# Patient Record
Sex: Female | Born: 1980 | Race: Black or African American | Hispanic: No | Marital: Married | Smoking: Current some day smoker
Health system: Southern US, Community
[De-identification: ages and names within clinical notes are randomized; demographics above are authoritative.]

## PROBLEM LIST (undated history)

## (undated) HISTORY — PX: CHOLECYSTECTOMY: SHX55

## (undated) HISTORY — PX: OTHER SURGICAL HISTORY: SHX169

---
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
## 2001-02-10 ENCOUNTER — Inpatient Hospital Stay (HOSPITAL_COMMUNITY): Admission: AD | Admit: 2001-02-10 | Discharge: 2001-02-13 | Payer: Self-pay | Admitting: Obstetrics & Gynecology

## 2001-02-12 ENCOUNTER — Encounter: Payer: Self-pay | Admitting: Obstetrics & Gynecology

## 2001-03-01 ENCOUNTER — Inpatient Hospital Stay (HOSPITAL_COMMUNITY): Admission: AD | Admit: 2001-03-01 | Discharge: 2001-03-01 | Payer: Self-pay | Admitting: Obstetrics and Gynecology

## 2001-04-30 ENCOUNTER — Other Ambulatory Visit: Admission: RE | Admit: 2001-04-30 | Discharge: 2001-04-30 | Payer: Self-pay | Admitting: Obstetrics and Gynecology

## 2001-05-30 ENCOUNTER — Ambulatory Visit (HOSPITAL_COMMUNITY): Admission: RE | Admit: 2001-05-30 | Discharge: 2001-05-30 | Payer: Self-pay | Admitting: Obstetrics and Gynecology

## 2001-05-30 ENCOUNTER — Encounter: Payer: Self-pay | Admitting: Obstetrics and Gynecology

## 2001-07-21 ENCOUNTER — Inpatient Hospital Stay (HOSPITAL_COMMUNITY): Admission: AD | Admit: 2001-07-21 | Discharge: 2001-07-23 | Payer: Self-pay | Admitting: Obstetrics and Gynecology

## 2003-07-09 ENCOUNTER — Other Ambulatory Visit: Admission: RE | Admit: 2003-07-09 | Discharge: 2003-07-09 | Payer: Self-pay | Admitting: Obstetrics and Gynecology

## 2003-09-08 ENCOUNTER — Encounter: Admission: RE | Admit: 2003-09-08 | Discharge: 2003-09-08 | Payer: Self-pay | Admitting: Gastroenterology

## 2004-02-17 ENCOUNTER — Other Ambulatory Visit: Admission: RE | Admit: 2004-02-17 | Discharge: 2004-02-17 | Payer: Self-pay | Admitting: Obstetrics and Gynecology

## 2004-08-09 ENCOUNTER — Observation Stay (HOSPITAL_COMMUNITY): Admission: RE | Admit: 2004-08-09 | Discharge: 2004-08-10 | Payer: Self-pay | Admitting: General Surgery

## 2005-04-19 ENCOUNTER — Other Ambulatory Visit: Admission: RE | Admit: 2005-04-19 | Discharge: 2005-04-19 | Payer: Self-pay | Admitting: Obstetrics and Gynecology

## 2005-09-27 ENCOUNTER — Ambulatory Visit (HOSPITAL_COMMUNITY): Admission: RE | Admit: 2005-09-27 | Discharge: 2005-09-27 | Payer: Self-pay | Admitting: Obstetrics and Gynecology

## 2005-12-10 ENCOUNTER — Inpatient Hospital Stay (HOSPITAL_COMMUNITY): Admission: AD | Admit: 2005-12-10 | Discharge: 2005-12-12 | Payer: Self-pay | Admitting: Obstetrics and Gynecology

## 2010-03-20 ENCOUNTER — Encounter: Payer: Self-pay | Admitting: Gastroenterology

## 2010-04-26 ENCOUNTER — Inpatient Hospital Stay (INDEPENDENT_AMBULATORY_CARE_PROVIDER_SITE_OTHER)
Admission: RE | Admit: 2010-04-26 | Discharge: 2010-04-26 | Disposition: A | Discharge status: Home / Self Care | Payer: 59 | Source: Ambulatory Visit | Attending: Family Medicine | Admitting: Family Medicine

## 2010-04-26 DIAGNOSIS — J40 Bronchitis, not specified as acute or chronic: Secondary | ICD-10-CM

## 2010-04-26 DIAGNOSIS — M549 Dorsalgia, unspecified: Secondary | ICD-10-CM

## 2010-04-26 DIAGNOSIS — R109 Unspecified abdominal pain: Secondary | ICD-10-CM

## 2010-04-26 LAB — POCT URINALYSIS DIPSTICK
Bilirubin Urine: NEGATIVE
Hgb urine dipstick: NEGATIVE
Ketones, ur: NEGATIVE mg/dL
Nitrite: NEGATIVE
Protein, ur: 30 mg/dL — AB
Specific Gravity, Urine: 1.025 (ref 1.005–1.030)
Urine Glucose, Fasting: NEGATIVE mg/dL
Urobilinogen, UA: 1 mg/dL (ref 0.0–1.0)
pH: 6.5 (ref 5.0–8.0)

## 2010-04-26 LAB — POCT PREGNANCY, URINE: Preg Test, Ur: NEGATIVE

## 2010-07-27 ENCOUNTER — Other Ambulatory Visit: Payer: Self-pay | Admitting: Obstetrics and Gynecology

## 2011-01-27 ENCOUNTER — Emergency Department (HOSPITAL_COMMUNITY)
Admission: EM | Admit: 2011-01-27 | Discharge: 2011-01-27 | Disposition: A | Discharge status: Home / Self Care | Payer: 59 | Source: Home / Self Care | Attending: Family Medicine | Admitting: Family Medicine

## 2011-01-27 DIAGNOSIS — J069 Acute upper respiratory infection, unspecified: Secondary | ICD-10-CM

## 2011-01-27 MED ORDER — AZITHROMYCIN 250 MG PO TABS: 250.0000 mg | ORAL_TABLET | Freq: Every day | ORAL | Status: AC

## 2011-01-27 MED ORDER — GUAIFENESIN-CODEINE 100-10 MG/5ML PO SYRP: 5.0000 mL | ORAL_SOLUTION | Freq: Four times a day (QID) | ORAL | Status: AC | PRN

## 2011-01-27 NOTE — ED Provider Notes (Signed)
History     CSN: [id number] Arrival date & time: 01/27/2011  9:45 AM   First MD Initiated Contact with Patient 01/27/11 1008      Chief Complaint  Patient presents with  . Cough    (Consider location/radiation/quality/duration/timing/severity/associated sxs/prior treatment) Patient is a 30 y.o. female presenting with cough. The history is provided by the patient.  Cough This is a new problem. The current episode started more than 2 days ago. The problem occurs constantly. The cough is non-productive. There has been no fever. Associated symptoms comments: NO RUNNY NOSE, NO SORE THROAT. HAS NOTED SOME SINUS CONGESTION AND POST NASAL DRIP. CHEST HURTS FROM COUGHING.HAS TAKEN OTC MEDS WITH MINIMAL RELIEF.    History reviewed. No pertinent past medical history.  Past Surgical History  Procedure Date  . Cholecystectomy     History reviewed. No pertinent family history.  History  Substance Use Topics  . Smoking status: Current Some Day Smoker  . Smokeless tobacco: Not on file  . Alcohol Use: No    OB History    Grav Para Term Preterm Abortions TAB SAB Ect Mult Living                  Review of Systems  Constitutional: Negative.   HENT: Positive for congestion.   Respiratory: Positive for cough.   Gastrointestinal: Negative.   Genitourinary: Negative.   Musculoskeletal: Positive for back pain.  Skin: Negative.     Allergies  Review of patient's allergies indicates no known allergies.  Home Medications   Current Outpatient Rx  Name Route Sig Dispense Refill  . AZITHROMYCIN 250 MG PO TABS Oral Take 1 tablet (250 mg total) by mouth daily. Take first 2 tablets together, then 1 every day until finished. 6 tablet 0  . GUAIFENESIN-CODEINE 100-10 MG/5ML PO SYRP Oral Take 5 mLs by mouth every 6 (six) hours as needed for cough. 120 mL 0    BP 117/77  Pulse 81  Temp(Src) 98.3 F (36.8 C) (Oral)  Resp 16  SpO2 100%  LMP 01/23/2011  Physical Exam  Nursing note and  vitals reviewed. Constitutional: She appears well-developed and well-nourished. No distress.  HENT:  Head: Normocephalic.  Right Ear: External ear normal.  Left Ear: External ear normal.  Nose: Nose normal.  Mouth/Throat: Oropharynx is clear and moist.  Neck: Normal range of motion. Neck supple.  Cardiovascular: Normal rate and regular rhythm.   Pulmonary/Chest: Effort normal and breath sounds normal. No respiratory distress.       CONGESTED COUGH  Lymphadenopathy:    She has no cervical adenopathy.  Skin: Skin is warm and dry.    ED Course  Procedures (including critical care time)  Labs Reviewed - No data to display No results found.   1. URI (upper respiratory infection)       MDM          Randa Spike, MD 01/27/11 [phone]

## 2011-01-27 NOTE — ED Notes (Signed)
3rd day duration of cough; c/o chest and neck discomfort , w cough until gags; green secretions since today; NAD

## 2011-04-19 ENCOUNTER — Emergency Department (HOSPITAL_COMMUNITY)
Admission: EM | Admit: 2011-04-19 | Discharge: 2011-04-19 | Disposition: A | Discharge status: Home / Self Care | Payer: 59 | Attending: Emergency Medicine | Admitting: Emergency Medicine

## 2011-04-19 ENCOUNTER — Encounter (HOSPITAL_COMMUNITY): Payer: Self-pay | Admitting: *Deleted

## 2011-04-19 DIAGNOSIS — M542 Cervicalgia: Secondary | ICD-10-CM | POA: Insufficient documentation

## 2011-04-19 DIAGNOSIS — M549 Dorsalgia, unspecified: Secondary | ICD-10-CM | POA: Insufficient documentation

## 2011-04-19 DIAGNOSIS — M7918 Myalgia, other site: Secondary | ICD-10-CM

## 2011-04-19 DIAGNOSIS — IMO0001 Reserved for inherently not codable concepts without codable children: Secondary | ICD-10-CM | POA: Insufficient documentation

## 2011-04-19 DIAGNOSIS — Y92410 Unspecified street and highway as the place of occurrence of the external cause: Secondary | ICD-10-CM | POA: Insufficient documentation

## 2011-04-19 MED ORDER — IBUPROFEN 600 MG PO TABS: 600.0000 mg | ORAL_TABLET | Freq: Four times a day (QID) | ORAL | Status: AC | PRN

## 2011-04-19 MED ORDER — TRAMADOL-ACETAMINOPHEN 37.5-325 MG PO TABS: ORAL_TABLET | ORAL | Status: AC

## 2011-04-19 MED ORDER — METHOCARBAMOL 500 MG PO TABS: 1000.0000 mg | ORAL_TABLET | Freq: Four times a day (QID) | ORAL | Status: AC

## 2011-04-19 MED ORDER — IBUPROFEN 800 MG PO TABS
800.0000 mg | ORAL_TABLET | Freq: Once | ORAL | Status: AC
  Administered 2011-04-19: 800 mg via ORAL
  Filled 2011-04-19: qty 1

## 2011-04-19 MED ORDER — CYCLOBENZAPRINE HCL 10 MG PO TABS
10.0000 mg | ORAL_TABLET | Freq: Once | ORAL | Status: AC
  Administered 2011-04-19: 10 mg via ORAL
  Filled 2011-04-19: qty 1

## 2011-04-19 NOTE — Discharge Instructions (Signed)
Ice packs to the injured or sore muscles for the next several days then start using heat. Take the medications for pain and muscle spasms.  Recheck if you aren't improving in the next week. You can call the office of Dr Thomasena Edis, the Orthopedist on call, to be rechecked if still painful next week.
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
## 2011-12-22 ENCOUNTER — Emergency Department (INDEPENDENT_AMBULATORY_CARE_PROVIDER_SITE_OTHER): Payer: 59

## 2011-12-22 ENCOUNTER — Encounter (HOSPITAL_COMMUNITY): Payer: Self-pay | Admitting: *Deleted

## 2011-12-22 ENCOUNTER — Emergency Department (INDEPENDENT_AMBULATORY_CARE_PROVIDER_SITE_OTHER)
Admission: EM | Admit: 2011-12-22 | Discharge: 2011-12-22 | Disposition: A | Discharge status: Home / Self Care | Payer: 59 | Source: Home / Self Care

## 2011-12-22 DIAGNOSIS — M542 Cervicalgia: Secondary | ICD-10-CM

## 2011-12-22 MED ORDER — NAPROXEN 375 MG PO TABS: 375.0000 mg | ORAL_TABLET | Freq: Two times a day (BID) | ORAL | Status: AC

## 2011-12-22 NOTE — ED Provider Notes (Signed)
Medical screening examination/treatment/procedure(s) were performed by non-physician practitioner and as supervising physician I was immediately available for consultation/collaboration.  Leslee Home, M.D.   Reuben Likes, MD 12/22/11 [phone]

## 2011-12-22 NOTE — ED Notes (Signed)
Pt  Reports  Neck pain   For  About  5  Months     denys  Any  specefic  Injury     She  Reports  The  Pain in  Her  Neck  Is  Worse  When  She  Swallows  -  She  Also  Reports  Some  Stiffness   r  Hand           denys  Any  Urinary  Symptoms    She  Is  Sitting  Upright on  Exam table  Speaking in  Complete  sentances
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
## 2011-12-22 NOTE — ED Notes (Signed)
Med    Cervical     Pains

## 2012-06-25 ENCOUNTER — Emergency Department (HOSPITAL_COMMUNITY)
Admission: EM | Admit: 2012-06-25 | Discharge: 2012-06-25 | Disposition: A | Discharge status: Home / Self Care | Payer: 59 | Source: Home / Self Care

## 2012-06-25 ENCOUNTER — Encounter (HOSPITAL_COMMUNITY): Payer: Self-pay | Admitting: *Deleted

## 2012-06-25 DIAGNOSIS — S2341XA Sprain of ribs, initial encounter: Secondary | ICD-10-CM

## 2012-06-25 MED ORDER — DICLOFENAC POTASSIUM 50 MG PO TABS: 50.0000 mg | ORAL_TABLET | Freq: Three times a day (TID) | ORAL | Status: DC

## 2012-06-25 NOTE — ED Notes (Signed)
Pt  States  l  Sided  Chest  Pain  Worse  When  She  Takes   A  Deep  Breath   She  Is  Sitting  Upright on  The  Exam table  Speaking in  Complete  sentances  And  Is  In no  Acute  Distress     Skin   Is  Warm   And  Dry   Pt is  Awake  And  Alert and  Oriented

## 2012-06-25 NOTE — ED Provider Notes (Signed)
History     CSN: [id number]  Arrival date & time 06/25/12  1701   None     Chief Complaint  Patient presents with  . Chest Pain    (Consider location/radiation/quality/duration/timing/severity/associated sxs/prior treatment) Patient is a 32 y.o. female presenting with chest pain. The history is provided by the patient.  Chest Pain Pain location:  L chest and L lateral chest Pain quality: sharp and stabbing   Pain radiates to:  Mid back Pain radiates to the back: yes   Pain severity:  Mild Duration:  6 hours Timing:  Constant Progression:  Unchanged Chronicity:  New Context: movement and raising an arm   Associated symptoms: no cough and no shortness of breath     History reviewed. No pertinent past medical history.  Past Surgical History  Procedure Laterality Date  . Cholecystectomy    . Btl      No family history on file.  History  Substance Use Topics  . Smoking status: Current Some Day Smoker    Types: Cigars  . Smokeless tobacco: Not on file  . Alcohol Use: No    OB History   Grav Para Term Preterm Abortions TAB SAB Ect Mult Living                  Review of Systems  Constitutional: Negative.   Respiratory: Negative for cough, chest tightness, shortness of breath and wheezing.   Cardiovascular: Positive for chest pain.  Gastrointestinal: Negative.     Allergies  Review of patient's allergies indicates no known allergies.  Home Medications   Current Outpatient Rx  Name  Route  Sig  Dispense  Refill  . diclofenac (CATAFLAM) 50 MG tablet   Oral   Take 1 tablet (50 mg total) by mouth 3 (three) times daily.   30 tablet   0   . ibuprofen (ADVIL,MOTRIN) 200 MG tablet   Oral   Take 400 mg by mouth every 6 (six) hours as needed. FOR PAIN         . naproxen (NAPROSYN) 375 MG tablet   Oral   Take 1 tablet (375 mg total) by mouth 2 (two) times daily.   20 tablet   0     BP 125/87  Pulse 78  Temp(Src) 98.7 F (37.1 C) (Oral)  Resp 16   SpO2 100%  LMP 06/07/2012  Physical Exam  Nursing note and vitals reviewed. Constitutional: She appears well-developed and well-nourished. She appears distressed.  Neck: Normal range of motion. Neck supple.  Cardiovascular: Normal rate, regular rhythm, normal heart sounds and intact distal pulses.   Pulmonary/Chest: Effort normal and breath sounds normal. She exhibits tenderness.  Abdominal: Soft. Bowel sounds are normal.  Skin: Skin is warm and dry.    ED Course  Procedures (including critical care time)  Labs Reviewed - No data to display No results found.   1. Costochondral joint sprain, initial encounter       MDM          Linna Hoff, MD 06/25/12 1747

## 2013-01-17 ENCOUNTER — Other Ambulatory Visit: Payer: Self-pay | Admitting: Obstetrics and Gynecology

## 2013-06-03 ENCOUNTER — Encounter (HOSPITAL_COMMUNITY): Payer: Self-pay | Admitting: Emergency Medicine

## 2013-06-03 ENCOUNTER — Emergency Department (HOSPITAL_COMMUNITY)
Admission: EM | Admit: 2013-06-03 | Discharge: 2013-06-03 | Disposition: A | Discharge status: Home / Self Care | Payer: 59 | Source: Home / Self Care

## 2013-06-03 DIAGNOSIS — J45909 Unspecified asthma, uncomplicated: Secondary | ICD-10-CM

## 2013-06-03 DIAGNOSIS — J309 Allergic rhinitis, unspecified: Secondary | ICD-10-CM

## 2013-06-03 DIAGNOSIS — H9209 Otalgia, unspecified ear: Secondary | ICD-10-CM

## 2013-06-03 DIAGNOSIS — Z720 Tobacco use: Secondary | ICD-10-CM

## 2013-06-03 DIAGNOSIS — H9202 Otalgia, left ear: Secondary | ICD-10-CM

## 2013-06-03 DIAGNOSIS — F17200 Nicotine dependence, unspecified, uncomplicated: Secondary | ICD-10-CM

## 2013-06-03 MED ORDER — ALBUTEROL SULFATE HFA 108 (90 BASE) MCG/ACT IN AERS: 2.0000 | INHALATION_SPRAY | RESPIRATORY_TRACT | Status: DC | PRN

## 2013-06-03 NOTE — ED Notes (Signed)
Pt denies fever,  N/v/d.  Mw,cma

## 2013-06-03 NOTE — ED Provider Notes (Signed)
CSN: [id number]     Arrival date & time 06/03/13  [id number] History   First MD Initiated Contact with Patient 06/03/13 [phone]     Chief Complaint  Patient presents with  . URI   (Consider location/radiation/quality/duration/timing/severity/associated sxs/prior Treatment) HPI Comments: 33 year old female presents with cough, any nose, watery itchy asHEENT: Supple, full range of motion without tenderness. A heavy feeling in the chest associated with PND and coughing scant amounts of yellow sputum She also has an area of pain in the left postauricular area. Denies pain in the ear. Continues to smoke. Has not taking any medications for her symptoms.   History reviewed. No pertinent past medical history. Past Surgical History  Procedure Laterality Date  . Cholecystectomy    . Btl     History reviewed. No pertinent family history. History  Substance Use Topics  . Smoking status: Current Some Day Smoker    Types: Cigars  . Smokeless tobacco: Not on file  . Alcohol Use: No   OB History   Grav Para Term Preterm Abortions TAB SAB Ect Mult Living                 Review of Systems  Constitutional: Negative for fever, chills, activity change, appetite change and fatigue.  HENT: Positive for congestion, postnasal drip and rhinorrhea. Negative for ear discharge, ear pain and facial swelling.   Eyes: Negative.   Respiratory: Positive for cough and chest tightness. Negative for shortness of breath and wheezing.   Cardiovascular: Negative.   Gastrointestinal: Negative.   Musculoskeletal: Negative for neck pain and neck stiffness.  Skin: Negative for pallor and rash.  Neurological: Negative.     Allergies  Review of patient's allergies indicates no known allergies.  Home Medications   Current Outpatient Rx  Name  Route  Sig  Dispense  Refill  . albuterol (PROVENTIL HFA;VENTOLIN HFA) 108 (90 BASE) MCG/ACT inhaler   Inhalation   Inhale 2 puffs into the lungs every 4 (four) hours as needed for  wheezing or shortness of breath.   1 Inhaler   0   . diclofenac (CATAFLAM) 50 MG tablet   Oral   Take 1 tablet (50 mg total) by mouth 3 (three) times daily.   30 tablet   0   . ibuprofen (ADVIL,MOTRIN) 200 MG tablet   Oral   Take 400 mg by mouth every 6 (six) hours as needed. FOR PAIN         . naproxen (NAPROSYN) 375 MG tablet   Oral   Take 1 tablet (375 mg total) by mouth 2 (two) times daily.   20 tablet   0    BP 108/71  Pulse 77  Temp(Src) 99 F (37.2 C) (Oral)  Resp 16  SpO2 100%  LMP 05/04/2013 Physical Exam  Nursing note and vitals reviewed. Constitutional: She is oriented to person, place, and time. She appears well-developed and well-nourished. No distress.  HENT:  Mouth/Throat: No oropharyngeal exudate.  Bilateral TMs are normal Oropharynx with no erythema, cobblestoning and clear PND Left postauricular exam reveals no skin discoloration, swelling or lesions. No tenderness to the left external ear. No regional lymphadenopathy. No tenderness directly over the mastoid.  Eyes: Conjunctivae and EOM are normal.  Neck: Normal range of motion. Neck supple.  Cardiovascular: Normal rate and regular rhythm.   Pulmonary/Chest: Effort normal and breath sounds normal. No respiratory distress. She has no rales.  Prolonged expiratory phase but no gross wheezing.  Abdominal: Soft. There is no tenderness.  Musculoskeletal: Normal range of motion. She exhibits no edema.  Lymphadenopathy:    She has no cervical adenopathy.  Neurological: She is alert and oriented to person, place, and time.  Skin: Skin is warm and dry. No rash noted.  Psychiatric: She has a normal mood and affect.    ED Course  Procedures (including critical care time) Labs Review Labs Reviewed - No data to display Imaging Review No results found.   MDM   1. Allergic rhinitis   2. RAD (reactive airway disease) with wheezing   3. Tobacco abuse disorder   4. Posterior auricular pain of left ear      No objective findings of etio for L post auricular pain and tenderness. IF worse will return or see PCP Flonase and saline NS Allegra 180 mg a day Albuterol for bronchospasm. Suspect this is treating to her cough as well as the PND. She does have the risk of bronchospasm due to smoking as well as RAD. Lots fo fluids Try to obtain a PCP ASAP Stop smoking.     Hayden Rasmussen, NP 06/03/13 1020

## 2013-06-03 NOTE — ED Notes (Signed)
C/o   Chest congestion.  Heaviness in chest.  Nonproductive cough.  Runny nose.   And pain behind left ear.  On set 4/3.   No otc meds taken for symptoms.

## 2013-06-03 NOTE — Discharge Instructions (Signed)
Allergic Rhinitis Allegra 180 mg a day Nasal saline spray, lots of it Flonase nasal spray as directed Allergic rhinitis is when the mucous membranes in the nose respond to allergens. Allergens are particles in the air that cause your body to have an allergic reaction. This causes you to release allergic antibodies. Through a chain of events, these eventually cause you to release histamine into the blood stream. Although meant to protect the body, it is this release of histamine that causes your discomfort, such as frequent sneezing, congestion, and an itchy, runny nose.  CAUSES  Seasonal allergic rhinitis (hay fever) is caused by pollen allergens that may come from grasses, trees, and weeds. Year-round allergic rhinitis (perennial allergic rhinitis) is caused by allergens such as house dust mites, pet dander, and mold spores.  SYMPTOMS   Nasal stuffiness (congestion).  Itchy, runny nose with sneezing and tearing of the eyes. DIAGNOSIS  Your health care provider can help you determine the allergen or allergens that trigger your symptoms. If you and your health care provider are unable to determine the allergen, skin or blood testing may be used. TREATMENT  Allergic Rhinitis does not have a cure, but it can be controlled by:  Medicines and allergy shots (immunotherapy).  Avoiding the allergen. Hay fever may often be treated with antihistamines in pill or nasal spray forms. Antihistamines block the effects of histamine. There are over-the-counter medicines that may help with nasal congestion and swelling around the eyes. Check with your health care provider before taking or giving this medicine.  If avoiding the allergen or the medicine prescribed do not work, there are many new medicines your health care provider can prescribe. Stronger medicine may be used if initial measures are ineffective. Desensitizing injections can be used if medicine and avoidance does not work. Desensitization is when a  patient is given ongoing shots until the body becomes less sensitive to the allergen. Make sure you follow up with your health care provider if problems continue. HOME CARE INSTRUCTIONS It is not possible to completely avoid allergens, but you can reduce your symptoms by taking steps to limit your exposure to them. It helps to know exactly what you are allergic to so that you can avoid your specific triggers. SEEK MEDICAL CARE IF:   You have a fever.  You develop a cough that does not stop easily (persistent).  You have shortness of breath.  You start wheezing.  Symptoms interfere with normal daily activities. Document Released: 11/08/2000 Document Revised: 12/04/2012 Document Reviewed: 10/21/2012 [facility] Patient Information 2014 [place].  Bronchospasm, Adult A bronchospasm is when the tubes that carry air in and out of your lungs (airwarys) spasm or tighten. During a bronchospasm it is hard to breathe. This is because the airways get smaller. A bronchospasm can be triggered by:  Allergies. These may be to animals, pollen, food, or mold.  Infection. This is a common cause of bronchospasm.  Exercise.  Irritants. These include pollution, cigarette smoke, strong odors, aerosol sprays, and paint fumes.  Weather changes.  Stress.  Being emotional. HOME CARE   Always have a plan for getting help. Know when to call your doctor and local emergency services (911 in the [place] Know where you can get emergency care.  Only take medicines as told by your doctor.  If you were prescribed an inhaler or nebulizer machine, ask your doctor how to use it correctly. Always use a spacer with your inhaler if you were given one.  Stay calm during  an attack. Try to relax and breathe more slowly.  Control your home environment:  Change your heating and air conditioning filter at least once a month.  Limit your use of fireplaces and wood stoves.  Do not  smoke. Do not  allow smoking in  your home.  Avoid perfumes and fragrances.  Get rid of pests (such as roaches and mice) and their droppings.  Throw away plants if you see mold on them.  Keep your house clean and dust free.  Replace carpet with wood, tile, or vinyl flooring. Carpet can trap dander and dust.  Use allergy-proof pillows, mattress covers, and box spring covers.  Wash bed sheets and blankets every week in hot water. Dry them in a dryer.  Use blankets that are made of polyester or cotton.  Wash hands frequently. GET HELP IF:  You have muscle aches.  You have chest pain.  The thick spit you spit or cough up (sputum) changes from clear or white to yellow, green, gray, or bloody.  The thick spit you spit or cough up gets thicker.  There are problems that may be related to the medicine you are given such as:  A rash.  Itching.  Swelling.  Trouble breathing. GET HELP RIGHT AWAY IF:  You feel you cannot breathe or catch your breath.  You cannot stop coughing.  Your treatment is not helping you breathe better. MAKE SURE YOU:   Understand these instructions.  Will watch your condition.  Will get help right away if you are not doing well or get worse. Document Released: 12/11/2008 Document Revised: 10/16/2012 Document Reviewed: 08/06/2012 [facility] Patient Information 2014 [place], [place].  How to Use an Inhaler Using your inhaler correctly is very important. Good technique will make sure that the medicine reaches your lungs.  HOW TO USE AN INHALER: 1. Take the cap off the inhaler. 2. If this is the first time using your inhaler, you need to prime it. Shake the inhaler for 5 seconds. Release four puffs into the air, away from your face. Ask your doctor for help if you have questions. 3. Shake the inhaler for 5 seconds. 4. Turn the inhaler so the bottle is above the mouthpiece. 5. Put your pointer finger on top of the bottle. Your thumb holds the bottom of the inhaler. 6. Open your  mouth. 7. Either hold the inhaler away from your mouth (the width of 2 fingers) or place your lips tightly around the mouthpiece. Ask your doctor which way to use your inhaler. 8. Breathe out as much air as possible. 9. Breathe in and push down on the bottle 1 time to release the medicine. You will feel the medicine go in your mouth and throat. 10. Continue to take a deep breath in very slowly. Try to fill your lungs. 11. After you have breathed in completely, hold your breath for 10 seconds. This will help the medicine to settle in your lungs. If you cannot hold your breath for 10 seconds, hold it for as long as you can before you breathe out. 12. Breathe out slowly, through pursed lips. Whistling is an example of pursed lips. 13. If your doctor has told you to take more than 1 puff, wait at least 15 30 seconds between puffs. This will help you get the best results from your medicine. Do not use the inhaler more than your doctor tells you to. 14. Put the cap back on the inhaler. 15. Follow the directions from your doctor or  from the inhaler package about cleaning the inhaler. If you use more than one inhaler, ask your doctor which inhalers to use and what order to use them in. Ask your doctor to help you figure out when you will need to refill your inhaler.  If you use a steroid inhaler, always rinse your mouth with water after your last puff, gargle and spit out the water. Do not swallow the water. GET HELP IF:  The inhaler medicine only partially helps to stop wheezing or shortness of breath.  You are having trouble using your inhaler.  You have some increase in thick spit (phlegm). GET HELP RIGHT AWAY IF:  The inhaler medicine does not help your wheezing or shortness of breath or you have tightness in your chest.  You have dizziness, headaches, or fast heart rate.  You have chills, fever, or night sweats.  You have a large increase of thick spit, or your thick spit is bloody. MAKE  SURE YOU:   Understand these instructions.  Will watch your condition.  Will get help right away if you are not doing well or get worse. Document Released: 11/23/2007 Document Revised: 12/04/2012 Document Reviewed: 09/12/2012 [facility] Patient Information 2014 [place], [place].  Smoking Cessation Quitting smoking is important to your health and has many advantages. However, it is not always easy to quit since nicotine is a very addictive drug. Often times, people try 3 times or more before being able to quit. This document explains the best ways for you to prepare to quit smoking. Quitting takes hard work and a lot of effort, but you can do it. ADVANTAGES OF QUITTING SMOKING  You will live longer, feel better, and live better.  Your body will feel the impact of quitting smoking almost immediately.  Within 20 minutes, blood pressure decreases. Your pulse returns to its normal level.  After 8 hours, carbon monoxide levels in the blood return to normal. Your oxygen level increases.  After 24 hours, the chance of having a heart attack starts to decrease. Your breath, hair, and body stop smelling like smoke.  After 48 hours, damaged nerve endings begin to recover. Your sense of taste and smell improve.  After 72 hours, the body is virtually free of nicotine. Your bronchial tubes relax and breathing becomes easier.  After 2 to 12 weeks, lungs can hold more air. Exercise becomes easier and circulation improves.  The risk of having a heart attack, stroke, cancer, or lung disease is greatly reduced.  After 1 year, the risk of coronary heart disease is cut in half.  After 5 years, the risk of stroke falls to the same as a nonsmoker.  After 10 years, the risk of lung cancer is cut in half and the risk of other cancers decreases significantly.  After 15 years, the risk of coronary heart disease drops, usually to the level of a nonsmoker.  If you are pregnant, quitting smoking will improve  your chances of having a healthy baby.  The people you live with, especially any children, will be healthier.  You will have extra money to spend on things other than cigarettes. QUESTIONS TO THINK ABOUT BEFORE ATTEMPTING TO QUIT You may want to talk about your answers with your caregiver.  Why do you want to quit?  If you tried to quit in the past, what helped and what did not?  What will be the most difficult situations for you after you quit? How will you plan to handle them?  Who can  help you through the tough times? Your family? Friends? A caregiver?  What pleasures do you get from smoking? What ways can you still get pleasure if you quit? Here are some questions to ask your caregiver:  How can you help me to be successful at quitting?  What medicine do you think would be best for me and how should I take it?  What should I do if I need more help?  What is smoking withdrawal like? How can I get information on withdrawal? GET READY  Set a quit date.  Change your environment by getting rid of all cigarettes, ashtrays, matches, and lighters in your home, car, or work. Do not let people smoke in your home.  Review your past attempts to quit. Think about what worked and what did not. GET SUPPORT AND ENCOURAGEMENT You have a better chance of being successful if you have help. You can get support in many ways.  Tell your family, friends, and co-workers that you are going to quit and need their support. Ask them not to smoke around you.  Get individual, group, or telephone counseling and support. Programs are available at [employer] and health centers. Call your local health department for information about programs in your area.  Spiritual beliefs and practices may help some smokers quit.  Download a "quit meter" on your computer to keep track of quit statistics, such as how long you have gone without smoking, cigarettes not smoked, and money saved.  Get a self-help  book about quitting smoking and staying off of tobacco. LEARN NEW SKILLS AND BEHAVIORS  Distract yourself from urges to smoke. Talk to someone, go for a walk, or occupy your time with a task.  Change your normal routine. Take a different route to work. Drink tea instead of coffee. Eat breakfast in a different place.  Reduce your stress. Take a hot bath, exercise, or read a book.  Plan something enjoyable to do every day. Reward yourself for not smoking.  Explore interactive web-based programs that specialize in helping you quit. GET MEDICINE AND USE IT CORRECTLY Medicines can help you stop smoking and decrease the urge to smoke. Combining medicine with the above behavioral methods and support can greatly increase your chances of successfully quitting smoking.  Nicotine replacement therapy helps deliver nicotine to your body without the negative effects and risks of smoking. Nicotine replacement therapy includes nicotine gum, lozenges, inhalers, nasal sprays, and skin patches. Some may be available over-the-counter and others require a prescription.  Antidepressant medicine helps people abstain from smoking, but how this works is unknown. This medicine is available by prescription.  Nicotinic receptor partial agonist medicine simulates the effect of nicotine in your brain. This medicine is available by prescription. Ask your caregiver for advice about which medicines to use and how to use them based on your health history. Your caregiver will tell you what side effects to look out for if you choose to be on a medicine or therapy. Carefully read the information on the package. Do not use any other product containing nicotine while using a nicotine replacement product.  RELAPSE OR DIFFICULT SITUATIONS Most relapses occur within the first 3 months after quitting. Do not be discouraged if you start smoking again. Remember, most people try several times before finally quitting. You may have symptoms  of withdrawal because your body is used to nicotine. You may crave cigarettes, be irritable, feel very hungry, cough often, get headaches, or have difficulty concentrating. The withdrawal symptoms  are only temporary. They are strongest when you first quit, but they will go away within 10 14 days. To reduce the chances of relapse, try to:  Avoid drinking alcohol. Drinking lowers your chances of successfully quitting.  Reduce the amount of caffeine you consume. Once you quit smoking, the amount of caffeine in your body increases and can give you symptoms, such as a rapid heartbeat, sweating, and anxiety.  Avoid smokers because they can make you want to smoke.  Do not let weight gain distract you. Many smokers will gain weight when they quit, usually less than 10 pounds. Eat a healthy diet and stay active. You can always lose the weight gained after you quit.  Find ways to improve your mood other than smoking. FOR MORE INFORMATION  www.smokefree.gov  Document Released: 02/07/2001 Document Revised: 08/15/2011 Document Reviewed: 05/25/2011 [facility] Patient Information 2014 [place], [place].

## 2013-06-03 NOTE — ED Provider Notes (Signed)
Medical screening examination/treatment/procedure(s) were performed by resident physician or non-physician practitioner and as supervising physician I was immediately available for consultation/collaboration.   Barkley BrunsKINDL,Faiga Stones DOUGLAS MD.   Linna HoffJames D Shera Laubach, MD 06/03/13 1400

## 2013-10-24 ENCOUNTER — Emergency Department (HOSPITAL_COMMUNITY)
Admission: EM | Admit: 2013-10-24 | Discharge: 2013-10-24 | Disposition: A | Discharge status: Home / Self Care | Payer: 59 | Attending: Emergency Medicine | Admitting: Emergency Medicine

## 2013-10-24 ENCOUNTER — Encounter (HOSPITAL_COMMUNITY): Payer: Self-pay | Admitting: Emergency Medicine

## 2013-10-24 DIAGNOSIS — S0993XA Unspecified injury of face, initial encounter: Secondary | ICD-10-CM | POA: Diagnosis present

## 2013-10-24 DIAGNOSIS — Y92410 Unspecified street and highway as the place of occurrence of the external cause: Secondary | ICD-10-CM | POA: Insufficient documentation

## 2013-10-24 DIAGNOSIS — F17200 Nicotine dependence, unspecified, uncomplicated: Secondary | ICD-10-CM | POA: Diagnosis not present

## 2013-10-24 DIAGNOSIS — Z791 Long term (current) use of non-steroidal anti-inflammatories (NSAID): Secondary | ICD-10-CM | POA: Insufficient documentation

## 2013-10-24 DIAGNOSIS — S46909A Unspecified injury of unspecified muscle, fascia and tendon at shoulder and upper arm level, unspecified arm, initial encounter: Secondary | ICD-10-CM | POA: Insufficient documentation

## 2013-10-24 DIAGNOSIS — S4980XA Other specified injuries of shoulder and upper arm, unspecified arm, initial encounter: Secondary | ICD-10-CM | POA: Diagnosis not present

## 2013-10-24 DIAGNOSIS — Y9389 Activity, other specified: Secondary | ICD-10-CM | POA: Insufficient documentation

## 2013-10-24 DIAGNOSIS — S199XXA Unspecified injury of neck, initial encounter: Principal | ICD-10-CM

## 2013-10-24 MED ORDER — CYCLOBENZAPRINE HCL 10 MG PO TABS: 10.0000 mg | ORAL_TABLET | Freq: Two times a day (BID) | ORAL | Status: DC | PRN

## 2013-10-24 MED ORDER — NAPROXEN 500 MG PO TABS: 500.0000 mg | ORAL_TABLET | Freq: Two times a day (BID) | ORAL | Status: AC

## 2013-10-24 MED ORDER — HYDROCODONE-ACETAMINOPHEN 5-325 MG PO TABS: 2.0000 | ORAL_TABLET | ORAL | Status: AC | PRN
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
## 2013-10-24 NOTE — ED Provider Notes (Signed)
Medical screening examination/treatment/procedure(s) were performed by non-physician practitioner and as supervising physician I was immediately available for consultation/collaboration.   EKG Interpretation None        Glynn Octave, MD 10/24/13 1610

## 2014-01-07 ENCOUNTER — Other Ambulatory Visit: Payer: Self-pay | Admitting: Obstetrics and Gynecology

## 2014-04-09 ENCOUNTER — Other Ambulatory Visit: Payer: Self-pay | Admitting: Obstetrics and Gynecology

## 2014-04-10 LAB — CYTOLOGY - PAP

## 2014-04-29 ENCOUNTER — Emergency Department (HOSPITAL_COMMUNITY)
Admission: EM | Admit: 2014-04-29 | Discharge: 2014-04-29 | Disposition: A | Discharge status: Home / Self Care | Payer: 59 | Source: Home / Self Care | Attending: Emergency Medicine | Admitting: Emergency Medicine

## 2014-04-29 ENCOUNTER — Encounter (HOSPITAL_COMMUNITY): Payer: Self-pay

## 2014-04-29 ENCOUNTER — Emergency Department (INDEPENDENT_AMBULATORY_CARE_PROVIDER_SITE_OTHER): Payer: 59

## 2014-04-29 DIAGNOSIS — K5900 Constipation, unspecified: Secondary | ICD-10-CM

## 2014-04-29 LAB — POCT URINALYSIS DIP (DEVICE)
Bilirubin Urine: NEGATIVE
GLUCOSE, UA: NEGATIVE mg/dL
Hgb urine dipstick: NEGATIVE
KETONES UR: NEGATIVE mg/dL
Leukocytes, UA: NEGATIVE
Nitrite: NEGATIVE
Protein, ur: NEGATIVE mg/dL
SPECIFIC GRAVITY, URINE: 1.015 (ref 1.005–1.030)
Urobilinogen, UA: 0.2 mg/dL (ref 0.0–1.0)
pH: 7 (ref 5.0–8.0)

## 2014-04-29 LAB — POCT PREGNANCY, URINE: PREG TEST UR: NEGATIVE

## 2014-04-29 MED ORDER — PEG 3350-KCL-NABCB-NACL-NASULF 236 G PO SOLR: ORAL | Status: DC

## 2014-04-29 MED ORDER — FLEET ENEMA 7-19 GM/118ML RE ENEM: 1.0000 | ENEMA | Freq: Once | RECTAL | Status: DC

## 2014-04-29 NOTE — Discharge Instructions (Signed)
You are quite constipated. This is responsible for your pain. Use the enema today. If you do not have a bowel movement after the enema, please start the GoLYTELY. If you have still not had a bowel movement after the GoLYTELY, or your pain gets worse at any point, or you start vomiting, or the dizziness gets worse, please go to the emergency room. Do not be surprised if you see some blood in the stool when you do have a bowel movement. You will likely need to take MiraLAX daily to keep your bowel movements soft.

## 2014-04-29 NOTE — ED Notes (Signed)
States she has always had a problem w irregular BM, and has not had one since early last week

## 2014-04-29 NOTE — ED Provider Notes (Signed)
CSN: [id number]     Arrival date & time 04/29/14  4098 History   First MD Initiated Contact with Patient 04/29/14 0914     Chief Complaint  Patient presents with  . Bloated   (Consider location/radiation/quality/duration/timing/severity/associated sxs/prior Treatment) HPI  She is a 34 year old woman here for evaluation of lower abdominal pain. She describes the pain as a constipated feeling. It is across the lower abdomen. She states last time she had a bowel movement was over one week ago. She will feel the urge to have a bowel movement, but when she sits on the toilet she is unable to have a bowel movement and will break out in a cold sweat and feel like she is about to pass out from the pain.  She reports some nausea, but no vomiting. She is able to eat and drink, but states smaller portions than normal. She has tried a laxative yesterday, without result.  History reviewed. No pertinent past medical history. Past Surgical History  Procedure Laterality Date  . Cholecystectomy    . Btl     History reviewed. No pertinent family history. History  Substance Use Topics  . Smoking status: Current Some Day Smoker    Types: Cigars  . Smokeless tobacco: Not on file  . Alcohol Use: Yes     Comment: occasianally   OB History    No data available     Review of Systems  Constitutional: Negative for fever and chills.  Gastrointestinal: Positive for nausea, abdominal pain, constipation and abdominal distention. Negative for vomiting and diarrhea.    Allergies  Review of patient's allergies indicates no known allergies.  Home Medications   Prior to Admission medications   Medication Sig Start Date End Date Taking? Authorizing Provider  albuterol (PROVENTIL HFA;VENTOLIN HFA) 108 (90 BASE) MCG/ACT inhaler Inhale 2 puffs into the lungs every 4 (four) hours as needed for wheezing or shortness of breath. 06/03/13   Hayden Rasmussen, NP  cyclobenzaprine (FLEXERIL) 10 MG tablet Take 1 tablet (10 mg  total) by mouth 2 (two) times daily as needed for muscle spasms. 10/24/13   Arthor Captain, PA-C  diclofenac (CATAFLAM) 50 MG tablet Take 1 tablet (50 mg total) by mouth 3 (three) times daily. 06/25/12   Linna Hoff, MD  HYDROcodone-acetaminophen (NORCO) 5-325 MG per tablet Take 2 tablets by mouth every 4 (four) hours as needed. 10/24/13   Arthor Captain, PA-C  ibuprofen (ADVIL,MOTRIN) 200 MG tablet Take 400 mg by mouth every 6 (six) hours as needed. FOR PAIN    Historical Provider, MD  naproxen (NAPROSYN) 375 MG tablet Take 1 tablet (375 mg total) by mouth 2 (two) times daily. 12/22/11   Hayden Rasmussen, NP  naproxen (NAPROSYN) 500 MG tablet Take 1 tablet (500 mg total) by mouth 2 (two) times daily with a meal. 10/24/13   Arthor Captain, PA-C  polyethylene glycol (GOLYTELY) 236 G solution Drink 8 ounces every 10 minutes until gone or stool is clear. 04/29/14   Charm Rings, MD  sodium phosphate (FLEET) 7-19 GM/118ML ENEM Place 133 mLs (1 enema total) rectally once. 04/29/14   Charm Rings, MD   BP 118/85 mmHg  Pulse 82  Temp(Src) 98.1 F (36.7 C) (Oral)  Resp 16  SpO2 100%  LMP 04/10/2014 Physical Exam  Constitutional: She is oriented to person, place, and time. She appears well-developed and well-nourished. She appears distressed (looks uncomfortable).  Cardiovascular: Normal rate, regular rhythm and normal heart sounds.   No murmur heard. Pulmonary/Chest:  Effort normal and breath sounds normal. No respiratory distress. She has no wheezes. She has no rales.  Abdominal: Soft. Bowel sounds are normal. She exhibits distension. She exhibits no mass. There is tenderness (Across lower abdomen). There is no rebound and no guarding.  Genitourinary: Rectal exam shows no external hemorrhoid, no internal hemorrhoid, no fissure, no tenderness and anal tone normal.  Small amount of hard stool in rectal vault.  Neurological: She is alert and oriented to person, place, and time.    ED Course  Procedures  (including critical care time) Labs Review Labs Reviewed  POCT URINALYSIS DIP (DEVICE)  POCT PREGNANCY, URINE    Imaging Review Dg Abd 1 View  04/29/2014   CLINICAL DATA:  Bloating, nausea. Discomfort at midline above pubic symphysis.  EXAM: ABDOMEN - 1 VIEW  COMPARISON:  None.  FINDINGS: Prior cholecystectomy. Mildly prominent upper abdominal small bowel loops. Gas and moderate stool throughout the colon. No organomegaly. No suspicious calcification. No acute bony abnormality.  IMPRESSION: Mildly prominent upper abdominal small bowel loops may reflect mild focal ileus. No convincing evidence for obstruction.  Prior cholecystectomy.   Electronically Signed   By: Charlett NoseKevin  Dover M.D.   On: 04/29/2014 10:05     MDM   1. Constipation, unspecified constipation type    No obstruction on x-ray. Will treat with a fleets enema at home. If this does not work, she can do the GoLYTELY. If her symptoms worsen at any time, or she does not have a bowel movement after the enema and GoLYTELY, she will go to the emergency room. Discussed dietary changes and regular MiraLAX use to regulate her bowel movements.    Charm RingsErin J Alaja Goldinger, MD 04/29/14 1020

## 2014-12-29 DIAGNOSIS — N909 Noninflammatory disorder of vulva and perineum, unspecified: Secondary | ICD-10-CM | POA: Insufficient documentation

## 2015-04-28 ENCOUNTER — Other Ambulatory Visit: Payer: Self-pay | Admitting: Obstetrics and Gynecology

## 2015-04-28 DIAGNOSIS — B009 Herpesviral infection, unspecified: Secondary | ICD-10-CM | POA: Insufficient documentation

## 2015-04-29 LAB — CYTOLOGY - PAP

## 2018-10-10 ENCOUNTER — Other Ambulatory Visit: Payer: Self-pay

## 2018-10-10 ENCOUNTER — Encounter (HOSPITAL_COMMUNITY): Payer: Self-pay | Admitting: Emergency Medicine

## 2018-10-10 ENCOUNTER — Emergency Department (HOSPITAL_COMMUNITY)
Admission: EM | Admit: 2018-10-10 | Discharge: 2018-10-10 | Discharge status: Against Medical Advice | Payer: Self-pay | Attending: Emergency Medicine | Admitting: Emergency Medicine

## 2018-10-10 ENCOUNTER — Emergency Department (HOSPITAL_COMMUNITY): Payer: Self-pay

## 2018-10-10 DIAGNOSIS — Z5321 Procedure and treatment not carried out due to patient leaving prior to being seen by health care provider: Secondary | ICD-10-CM | POA: Insufficient documentation

## 2018-10-10 DIAGNOSIS — R0602 Shortness of breath: Secondary | ICD-10-CM | POA: Insufficient documentation

## 2018-10-10 LAB — I-STAT BETA HCG BLOOD, ED (MC, WL, AP ONLY): I-stat hCG, quantitative: <5 m[IU]/mL (ref <5)

## 2018-10-10 NOTE — ED Triage Notes (Signed)
Pt c/o shortness of breath and heavy vaginal bleeding. Reports that she is having to change her pad every 5-10 minutes. Last normal period, June.

## 2018-10-10 NOTE — ED Notes (Signed)
Pt. Leaving as she is tired. Pulling OTF

## 2021-04-29 ENCOUNTER — Encounter (INDEPENDENT_AMBULATORY_CARE_PROVIDER_SITE_OTHER): Payer: Self-pay

## 2021-04-29 ENCOUNTER — Encounter: Payer: Self-pay | Admitting: Family Medicine

## 2021-04-29 ENCOUNTER — Other Ambulatory Visit: Payer: Self-pay

## 2021-04-29 ENCOUNTER — Ambulatory Visit (INDEPENDENT_AMBULATORY_CARE_PROVIDER_SITE_OTHER): Payer: Self-pay | Admitting: Family Medicine

## 2021-04-29 VITALS — BP 140/91 | HR 81 | Temp 98.1°F | Resp 16 | Ht 62.0 in | Wt 129.2 lb

## 2021-04-29 DIAGNOSIS — N946 Dysmenorrhea, unspecified: Secondary | ICD-10-CM | POA: Insufficient documentation

## 2021-04-29 DIAGNOSIS — N760 Acute vaginitis: Secondary | ICD-10-CM

## 2021-04-29 DIAGNOSIS — Z7689 Persons encountering health services in other specified circumstances: Secondary | ICD-10-CM

## 2021-04-29 DIAGNOSIS — R030 Elevated blood-pressure reading, without diagnosis of hypertension: Secondary | ICD-10-CM

## 2021-04-29 DIAGNOSIS — F17200 Nicotine dependence, unspecified, uncomplicated: Secondary | ICD-10-CM | POA: Insufficient documentation

## 2021-04-29 MED ORDER — FLUCONAZOLE 150 MG PO TABS: ORAL_TABLET | ORAL | 0 refills | Status: AC

## 2021-04-29 MED ORDER — METRONIDAZOLE 500 MG PO TABS: 500.0000 mg | ORAL_TABLET | Freq: Two times a day (BID) | ORAL | 0 refills | Status: AC

## 2021-04-29 NOTE — Progress Notes (Signed)
Patient is here to est care. Patient c/o yeast infection that is very frequent. Patient say it is usually around her cycle time ?Patient has no other concerns for provider today. ? ?

## 2021-04-29 NOTE — Progress Notes (Signed)
? ?New Patient Office Visit ? ?Subjective:  ?Patient ID: Paula Walker, female    DOB: 1980-10-08  Age: 41 y.o. MRN: [id number] ? ?CC:  ?Chief Complaint  ?Patient presents with  ? Establish Care  ? Vaginitis  ? ? ?HPI ?Paula Walker presents for to establish care. Patient complains of persistent/recurrent vaginal yeast infections. She is unable to utilize the OTC products 2/2 vaginal burning.  ? ?History reviewed. No pertinent past medical history. ? ?Past Surgical History:  ?Procedure Laterality Date  ? btl    ? CHOLECYSTECTOMY    ? ? ?History reviewed. No pertinent family history. ? ?Social History  ? ?Socioeconomic History  ? Marital status: Married  ?  Spouse name: Not on file  ? Number of children: Not on file  ? Years of education: Not on file  ? Highest education level: Not on file  ?Occupational History  ? Not on file  ?Tobacco Use  ? Smoking status: Some Days  ?  Types: Cigars  ? Smokeless tobacco: Never  ?Substance and Sexual Activity  ? Alcohol use: Yes  ?  Comment: occasianally  ? Drug use: No  ? Sexual activity: Yes  ?Other Topics Concern  ? Not on file  ?Social History Narrative  ? Not on file  ? ?Social Determinants of Health  ? ?Financial Resource Strain: Not on file  ?Food Insecurity: Not on file  ?Transportation Needs: Not on file  ?Physical Activity: Not on file  ?Stress: Not on file  ?Social Connections: Not on file  ?Intimate Partner Violence: Not on file  ? ? ?ROS ?Review of Systems  ?Genitourinary:  Positive for vaginal discharge. Negative for dysuria and genital sores.  ?All other systems reviewed and are negative. ? ?Objective:  ? ?Today's Vitals: BP (!) 140/91   Pulse 81   Temp 98.1 ?F (36.7 ?C) (Oral)   Resp 16   Ht 5\' 2"  (1.575 m)   Wt 129 lb 3.2 oz (58.6 kg)   BMI 23.63 kg/m?  ? ?Physical Exam ?Vitals and nursing note reviewed.  ?Constitutional:   ?   General: She is not in acute distress. ?Cardiovascular:  ?   Rate and Rhythm: Normal rate and regular rhythm.  ?Pulmonary:  ?    Effort: Pulmonary effort is normal.  ?   Breath sounds: Normal breath sounds.  ?Abdominal:  ?   Palpations: Abdomen is soft.  ?   Tenderness: There is no abdominal tenderness.  ?Genitourinary: ?   Comments: Exam and testing deferred 2/2 menses ?Neurological:  ?   General: No focal deficit present.  ?   Mental Status: She is alert and oriented to person, place, and time.  ? ? ?Assessment & Plan:  ? ?1. Vaginitis and vulvovaginitis ?Discussed in detail. Diflucan and flagyl prescribed.  ? ?2. Elevated blood pressure reading in office without diagnosis of hypertension ?Will monitor. Patient to return for CPE ? ?3. Encounter to establish care ? ? ?Outpatient Encounter Medications as of 04/29/2021  ?Medication Sig  ? ibuprofen (ADVIL,MOTRIN) 200 MG tablet Take 400 mg by mouth every 6 (six) hours as needed. FOR PAIN  ? naproxen (NAPROSYN) 375 MG tablet Take 1 tablet (375 mg total) by mouth 2 (two) times daily.  ? naproxen (NAPROSYN) 500 MG tablet Take 1 tablet (500 mg total) by mouth 2 (two) times daily with a meal.  ? HYDROcodone-acetaminophen (NORCO) 5-325 MG per tablet Take 2 tablets by mouth every 4 (four) hours as needed. (Patient not taking: Reported  on 04/29/2021)  ? [DISCONTINUED] albuterol (PROVENTIL HFA;VENTOLIN HFA) 108 (90 BASE) MCG/ACT inhaler Inhale 2 puffs into the lungs every 4 (four) hours as needed for wheezing or shortness of breath.  ? [DISCONTINUED] cyclobenzaprine (FLEXERIL) 10 MG tablet Take 1 tablet (10 mg total) by mouth 2 (two) times daily as needed for muscle spasms.  ? [DISCONTINUED] diclofenac (CATAFLAM) 50 MG tablet Take 1 tablet (50 mg total) by mouth 3 (three) times daily.  ? [DISCONTINUED] polyethylene glycol (GOLYTELY) 236 G solution Drink 8 ounces every 10 minutes until gone or stool is clear.  ? [DISCONTINUED] sodium phosphate (FLEET) 7-19 GM/118ML ENEM Place 133 mLs (1 enema total) rectally once.  ? ?No facility-administered encounter medications on file as of 04/29/2021.  ? ? ?Follow-up: No  follow-ups on file.  ? ?Tommie Raymond, MD ? ?

## 2021-05-02 ENCOUNTER — Encounter: Payer: Self-pay | Admitting: Family Medicine

## 2021-05-14 ENCOUNTER — Other Ambulatory Visit: Payer: Self-pay

## 2021-05-14 ENCOUNTER — Emergency Department (HOSPITAL_COMMUNITY)
Admission: EM | Admit: 2021-05-14 | Discharge: 2021-05-15 | Disposition: A | Discharge status: Home / Self Care | Payer: Self-pay | Attending: Emergency Medicine | Admitting: Emergency Medicine

## 2021-05-14 ENCOUNTER — Encounter (HOSPITAL_COMMUNITY): Payer: Self-pay

## 2021-05-14 DIAGNOSIS — Y92410 Unspecified street and highway as the place of occurrence of the external cause: Secondary | ICD-10-CM | POA: Diagnosis not present

## 2021-05-14 DIAGNOSIS — S060XAA Concussion with loss of consciousness status unknown, initial encounter: Secondary | ICD-10-CM

## 2021-05-14 DIAGNOSIS — S161XXA Strain of muscle, fascia and tendon at neck level, initial encounter: Secondary | ICD-10-CM

## 2021-05-14 DIAGNOSIS — Z91040 Latex allergy status: Secondary | ICD-10-CM | POA: Insufficient documentation

## 2021-05-14 DIAGNOSIS — S0990XA Unspecified injury of head, initial encounter: Secondary | ICD-10-CM | POA: Diagnosis present

## 2021-05-14 LAB — POC URINE PREG, ED: Preg Test, Ur: NEGATIVE
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
## 2021-05-15 ENCOUNTER — Emergency Department (HOSPITAL_COMMUNITY): Payer: Self-pay

## 2021-05-15 MED ORDER — METHOCARBAMOL 500 MG PO TABS
1000.0000 mg | ORAL_TABLET | Freq: Once | ORAL | Status: DC
  Filled 2021-05-15: qty 2

## 2021-05-15 MED ORDER — ACETAMINOPHEN 500 MG PO TABS
1000.0000 mg | ORAL_TABLET | Freq: Once | ORAL | Status: AC
  Administered 2021-05-15: 1000 mg via ORAL
  Filled 2021-05-15: qty 2

## 2021-05-15 MED ORDER — METHOCARBAMOL 500 MG PO TABS: 1000.0000 mg | ORAL_TABLET | Freq: Three times a day (TID) | ORAL | 0 refills | Status: AC | PRN

## 2021-05-15 NOTE — ED Notes (Signed)
Patient returned from CT at this time.
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
## 2023-01-25 IMAGING — CT CT HEAD W/O CM
3 series · 15 of 46 positions shown, 18 images · non-contrast
Comparison: None.

CLINICAL DATA: Trauma



[Series 3: head wo · axial · 0.39mm/px · z∈[+274,+394]mm · 9 of 29 slices shown, 12 images]
[im 3/29  brain]
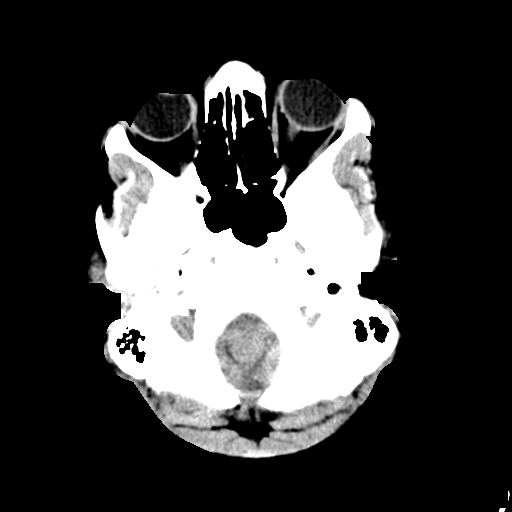
[im 3/29  bone]
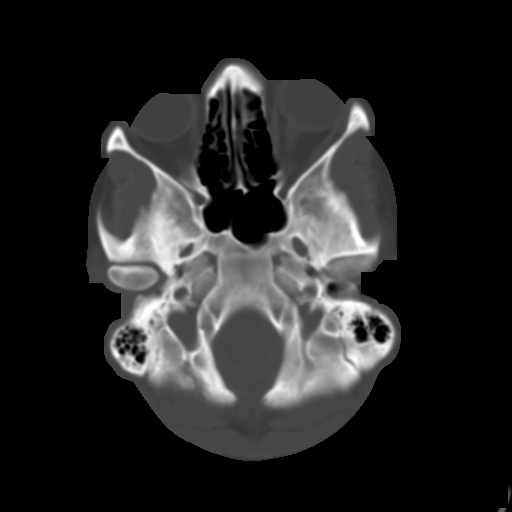
[im 6/29  brain]
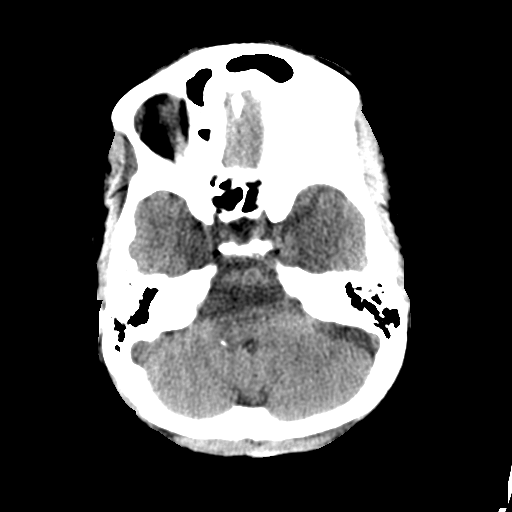
[im 9/29  brain]
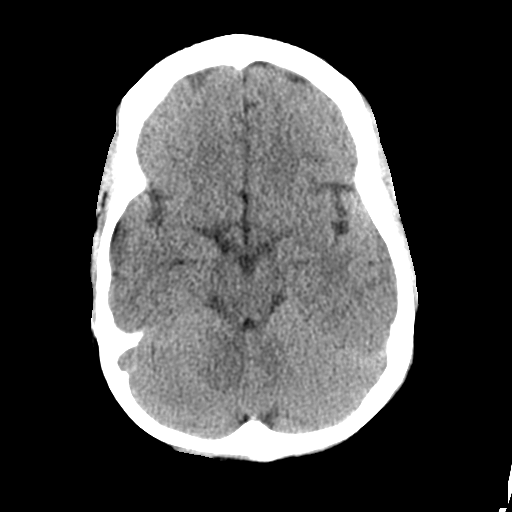
[im 12/29  brain]
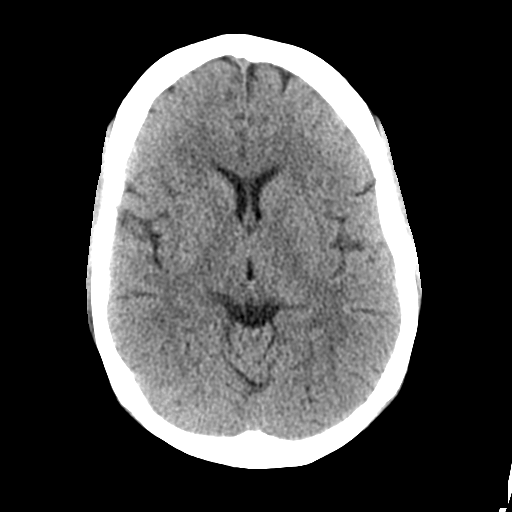
[im 15/29  brain]
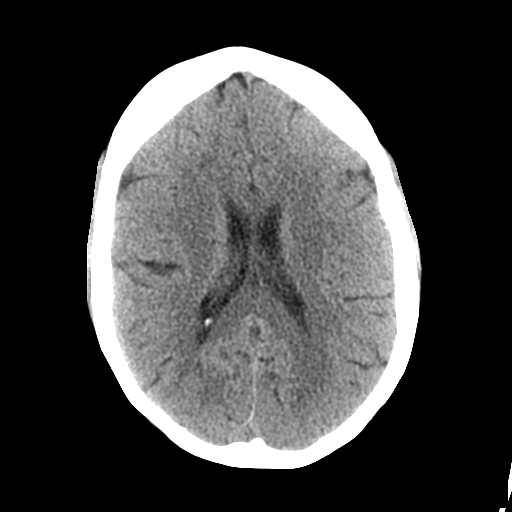
[im 15/29  bone]
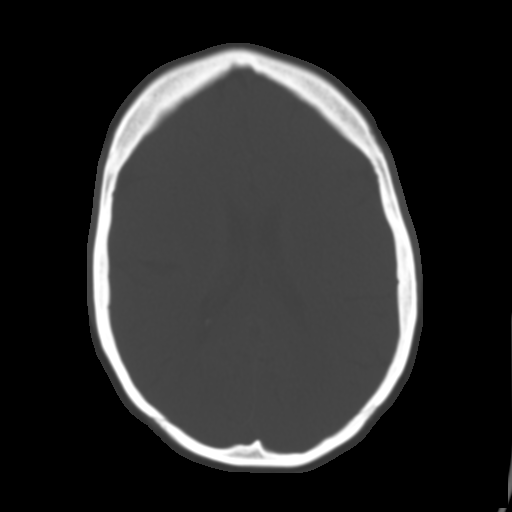
[im 18/29  brain]
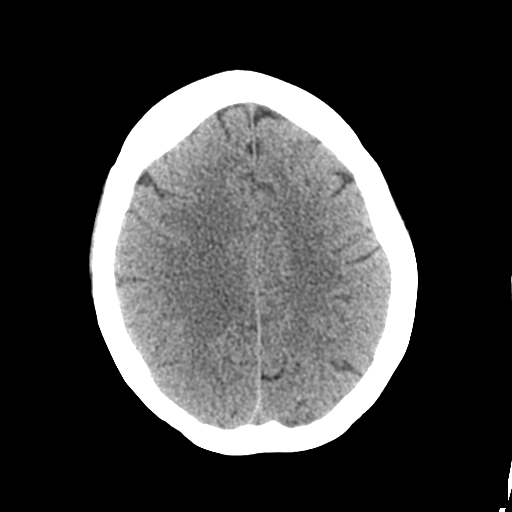
[im 21/29  brain]
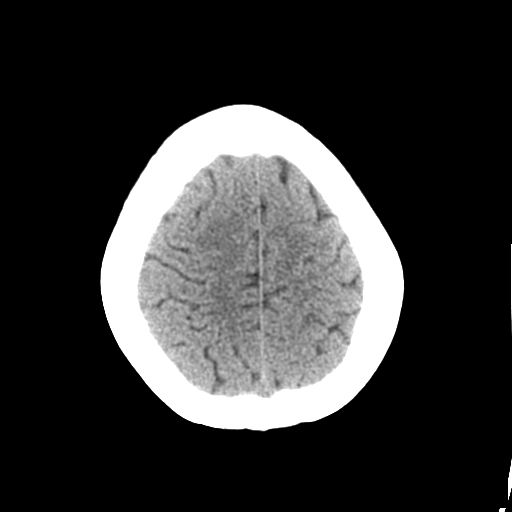
[im 24/29  brain]
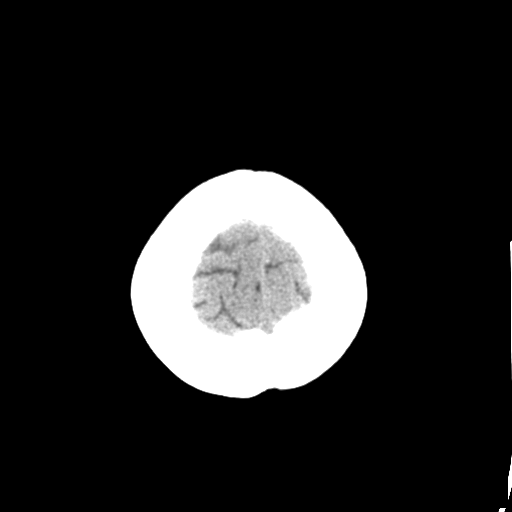
[im 27/29  brain]
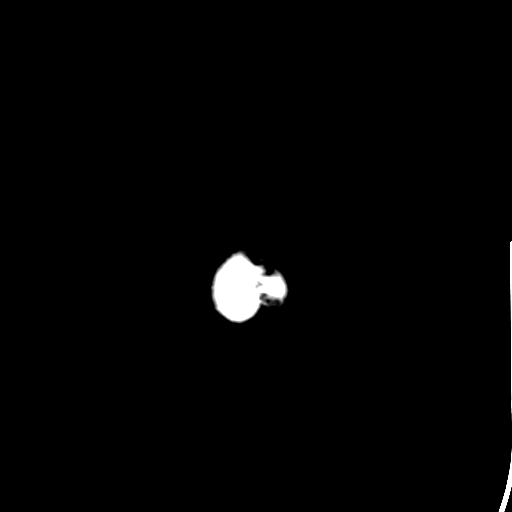
[im 27/29  bone]
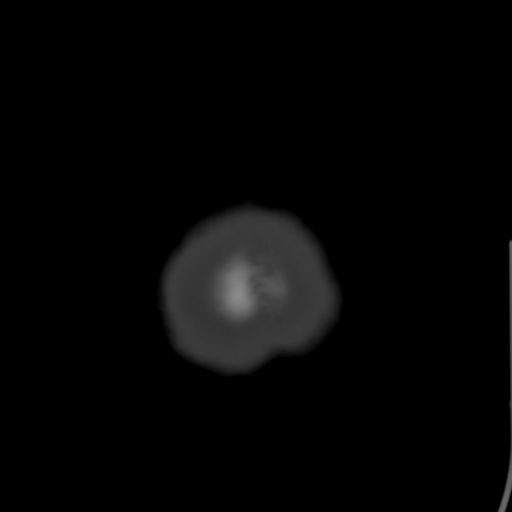

[Series 6: coronal soft tissue · coronal · 0.29mm/px · 3 of 68 slices shown]
[im 23/68  brain]
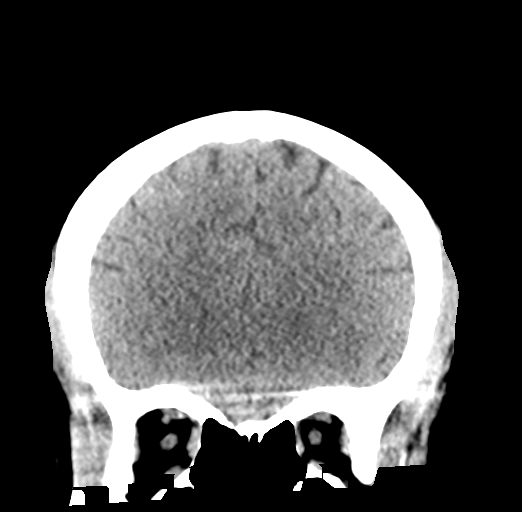
[im 30/68  brain]
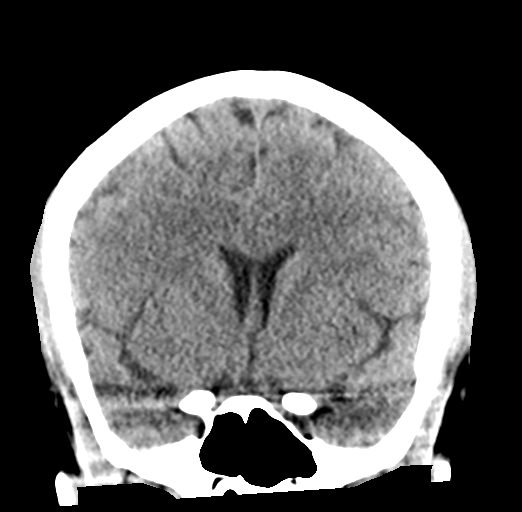
[im 38/68  brain]
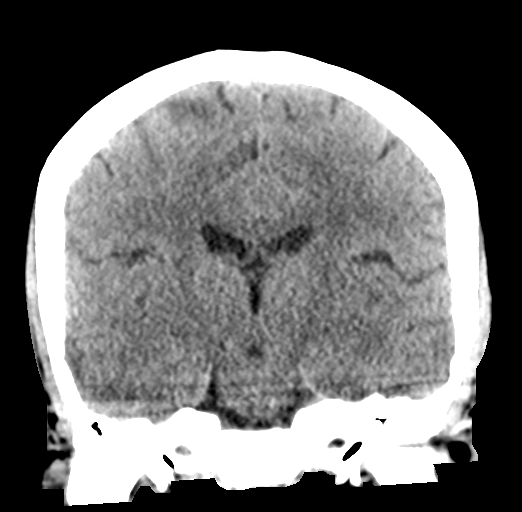

[Series 7: sagittal soft tissue · sagittal · 0.29mm/px · 3 of 50 slices shown]
[im 17/50  brain]
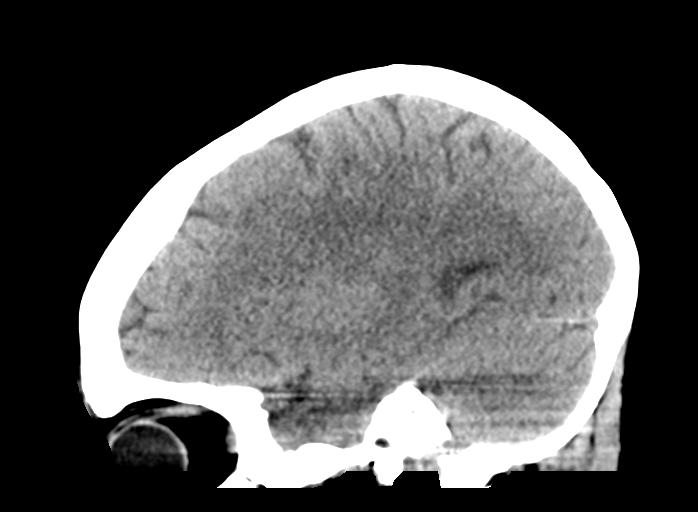
[im 25/50  brain]
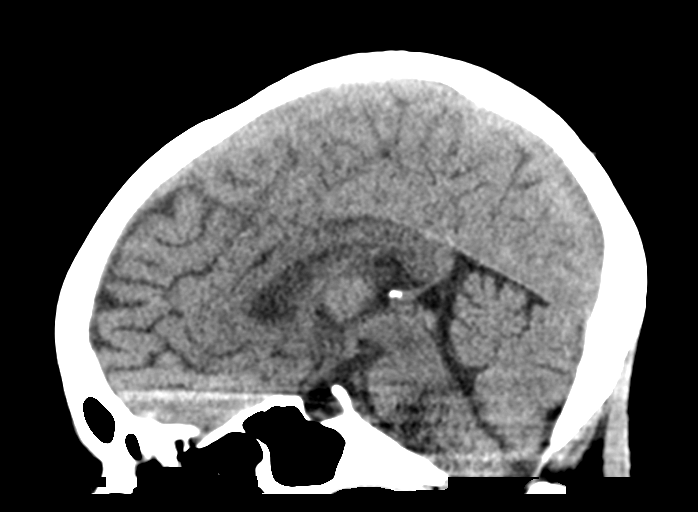
[im 33/50  brain]
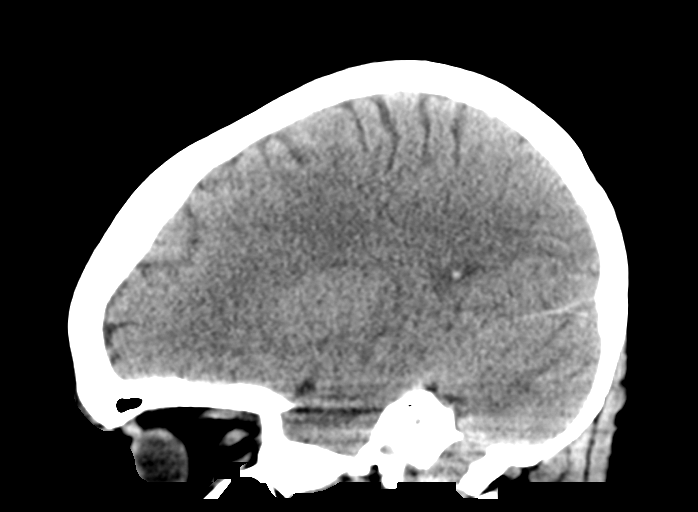

[15 of 46 positions shown; findings below may reference images not displayed]

FINDINGS: Brain: There is no mass, hemorrhage or extra-axial collection. The
size and configuration of the ventricles and extra-axial CSF spaces
are normal. The brain parenchyma is normal, without acute or chronic
infarction.

Vascular: No abnormal hyperdensity of the major intracranial
arteries or dural venous sinuses. No intracranial atherosclerosis.

Skull: The visualized skull base, calvarium and extracranial soft
tissues are normal.

Sinuses/Orbits: No fluid levels or advanced mucosal thickening of
the visualized paranasal sinuses. No mastoid or middle ear effusion.
The orbits are normal.
IMPRESSION: Normal head CT.
